# Patient Record
Sex: Male | Born: 1965 | Race: White | Hispanic: No | Marital: Single | Smoking: Current every day smoker
Health system: Southern US, Community
[De-identification: ages and names within clinical notes are randomized; demographics above are authoritative.]

## PROBLEM LIST (undated history)

## (undated) DIAGNOSIS — E7439 Other disorders of intestinal carbohydrate absorption: Secondary | ICD-10-CM

## (undated) HISTORY — PX: FOOT SURGERY: SHX648

---

## 2008-02-07 ENCOUNTER — Emergency Department (HOSPITAL_COMMUNITY): Admission: EM | Admit: 2008-02-07 | Discharge: 2008-02-08 | Payer: Self-pay | Admitting: Emergency Medicine

## 2008-11-22 ENCOUNTER — Emergency Department (HOSPITAL_COMMUNITY): Admission: EM | Admit: 2008-11-22 | Discharge: 2008-11-22 | Payer: Self-pay | Admitting: Emergency Medicine

## 2008-12-06 ENCOUNTER — Emergency Department (HOSPITAL_COMMUNITY): Admission: EM | Admit: 2008-12-06 | Discharge: 2008-12-06 | Payer: Self-pay | Admitting: Emergency Medicine

## 2009-08-14 IMAGING — CR DG THORACIC SPINE 2V
2 series · 2 of 2 positions shown · non-contrast
Comparison: None

CLINICAL DATA: Back and shoulder pain

THORACIC SPINE - 2 VIEW

[t t-spine a.p.]
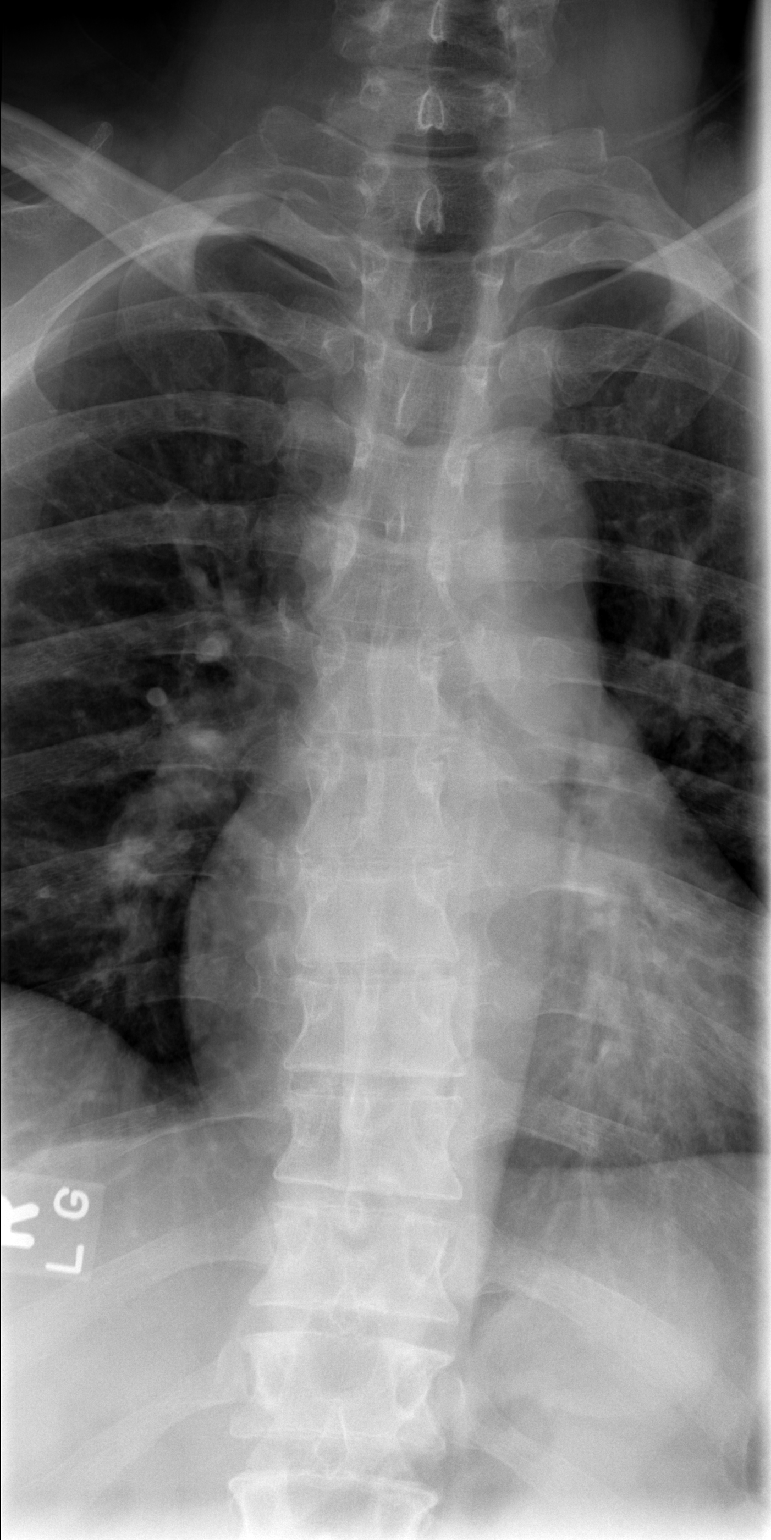

[t t-spine lat]
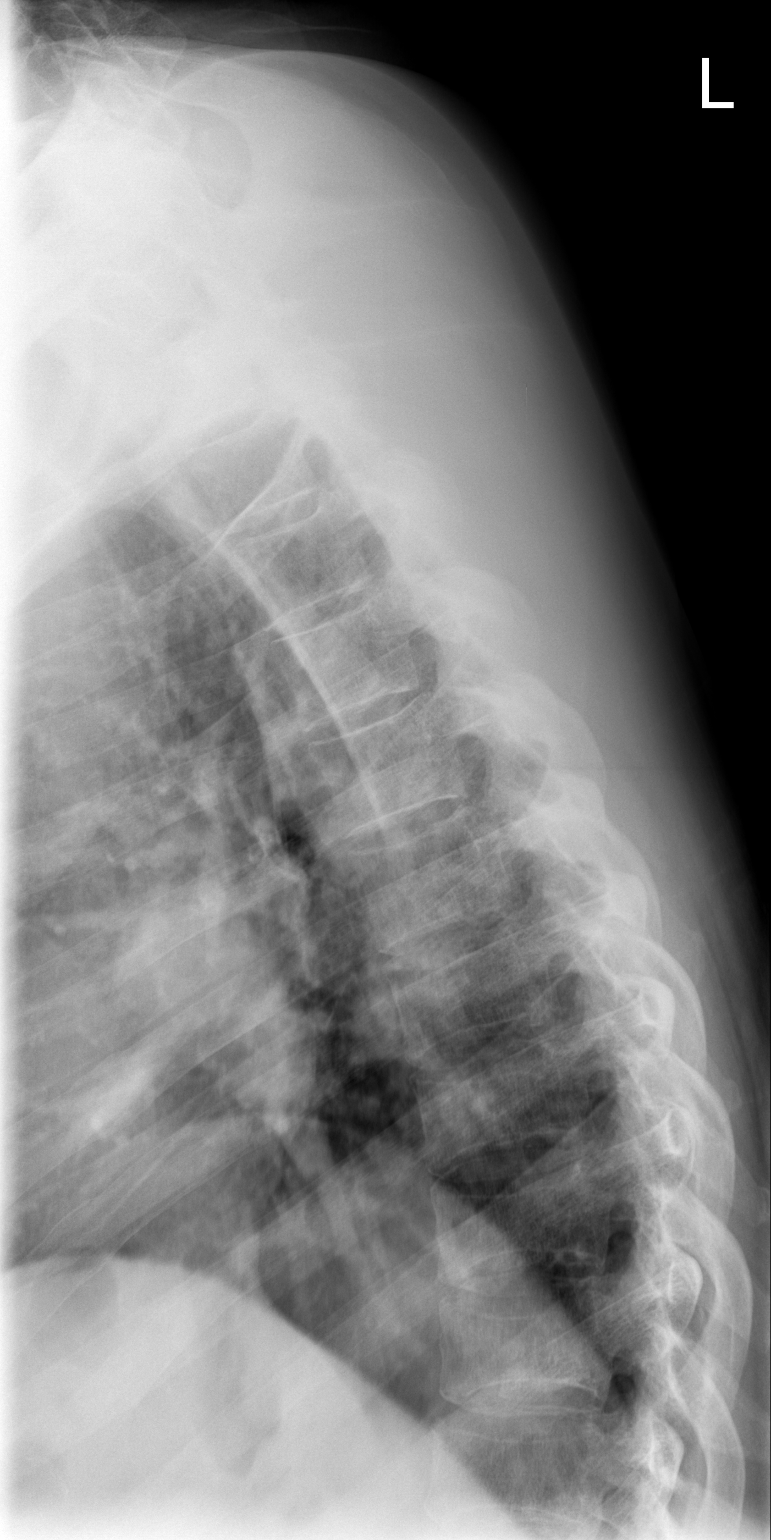

[2 of 2 positions shown; findings below may reference images not displayed]

FINDINGS: There is a mild thoracic - lumbar scoliosis deformity.

The vertebral body heights and disc spaces are well preserved.

No fractures are identified.
IMPRESSION: 1.  No acute findings.

## 2018-01-04 ENCOUNTER — Encounter (HOSPITAL_COMMUNITY): Payer: Self-pay | Admitting: Emergency Medicine

## 2018-01-04 ENCOUNTER — Emergency Department (HOSPITAL_COMMUNITY): Payer: Self-pay

## 2018-01-04 ENCOUNTER — Observation Stay (HOSPITAL_COMMUNITY)
Admission: EM | Admit: 2018-01-04 | Discharge: 2018-01-06 | Disposition: A | Discharge status: Home / Self Care | Payer: Self-pay | Attending: Internal Medicine | Admitting: Internal Medicine

## 2018-01-04 ENCOUNTER — Other Ambulatory Visit: Payer: Self-pay

## 2018-01-04 DIAGNOSIS — I25759 Atherosclerosis of native coronary artery of transplanted heart with unspecified angina pectoris: Secondary | ICD-10-CM | POA: Diagnosis present

## 2018-01-04 DIAGNOSIS — F1010 Alcohol abuse, uncomplicated: Secondary | ICD-10-CM | POA: Insufficient documentation

## 2018-01-04 DIAGNOSIS — L03011 Cellulitis of right finger: Secondary | ICD-10-CM | POA: Insufficient documentation

## 2018-01-04 DIAGNOSIS — R079 Chest pain, unspecified: Secondary | ICD-10-CM

## 2018-01-04 DIAGNOSIS — F17200 Nicotine dependence, unspecified, uncomplicated: Secondary | ICD-10-CM | POA: Insufficient documentation

## 2018-01-04 DIAGNOSIS — I10 Essential (primary) hypertension: Secondary | ICD-10-CM | POA: Diagnosis present

## 2018-01-04 DIAGNOSIS — R7302 Impaired glucose tolerance (oral): Secondary | ICD-10-CM | POA: Insufficient documentation

## 2018-01-04 DIAGNOSIS — R0789 Other chest pain: Principal | ICD-10-CM | POA: Insufficient documentation

## 2018-01-04 HISTORY — DX: Other disorders of intestinal carbohydrate absorption: E74.39

## 2018-01-04 LAB — PROTIME-INR
INR: 1.03
Prothrombin Time: 13.4 seconds (ref 11.4–15.2)

## 2018-01-04 LAB — CBC
HCT: 44 % (ref 39.0–52.0)
HEMOGLOBIN: 14.8 g/dL (ref 13.0–17.0)
MCH: 30.9 pg (ref 26.0–34.0)
MCHC: 33.6 g/dL (ref 30.0–36.0)
MCV: 91.9 fL (ref 78.0–100.0)
Platelets: 253 10*3/uL (ref 150–400)
RBC: 4.79 MIL/uL (ref 4.22–5.81)
RDW: 12.6 % (ref 11.5–15.5)
WBC: 8.9 10*3/uL (ref 4.0–10.5)

## 2018-01-04 LAB — BASIC METABOLIC PANEL
Anion gap: 11 (ref 5–15)
BUN: 21 mg/dL — ABNORMAL HIGH (ref 6–20)
CO2: 20 mmol/L — AB (ref 22–32)
Calcium: 9.3 mg/dL (ref 8.9–10.3)
Chloride: 108 mmol/L (ref 98–111)
Creatinine, Ser: 0.9 mg/dL (ref 0.61–1.24)
GFR calc non Af Amer: >60 mL/min (ref >60)
Glucose, Bld: 111 mg/dL — ABNORMAL HIGH (ref 70–99)
POTASSIUM: 4.1 mmol/L (ref 3.5–5.1)
Sodium: 139 mmol/L (ref 135–145)

## 2018-01-04 LAB — I-STAT TROPONIN, ED: Troponin i, poc: 0 ng/mL (ref 0.00–0.08)

## 2018-01-04 LAB — TROPONIN I

## 2018-01-04 LAB — APTT: APTT: 35 s (ref 24–36)

## 2018-01-04 LAB — MRSA PCR SCREENING: MRSA BY PCR: NEGATIVE

## 2018-01-04 MED ORDER — SODIUM CHLORIDE 0.9 % IV SOLN: 250.0000 mL | INTRAVENOUS | Status: DC | PRN

## 2018-01-04 MED ORDER — ADULT MULTIVITAMIN W/MINERALS CH
1.0000 | ORAL_TABLET | Freq: Every day | ORAL | Status: DC
  Administered 2018-01-04 – 2018-01-06 (×3): 1 via ORAL
  Filled 2018-01-04 (×3): qty 1

## 2018-01-04 MED ORDER — ACETAMINOPHEN 325 MG PO TABS
650.0000 mg | ORAL_TABLET | Freq: Four times a day (QID) | ORAL | Status: DC | PRN
  Administered 2018-01-05: 650 mg via ORAL
  Filled 2018-01-04: qty 2

## 2018-01-04 MED ORDER — NITROGLYCERIN 0.4 MG SL SUBL
0.4000 mg | SUBLINGUAL_TABLET | SUBLINGUAL | Status: AC | PRN
  Administered 2018-01-04 (×3): 0.4 mg via SUBLINGUAL
  Filled 2018-01-04: qty 1

## 2018-01-04 MED ORDER — CARVEDILOL 3.125 MG PO TABS
3.1250 mg | ORAL_TABLET | Freq: Two times a day (BID) | ORAL | Status: DC
  Administered 2018-01-04 – 2018-01-05 (×2): 3.125 mg via ORAL
  Filled 2018-01-04 (×2): qty 1

## 2018-01-04 MED ORDER — ASPIRIN EC 325 MG PO TBEC
325.0000 mg | DELAYED_RELEASE_TABLET | Freq: Every day | ORAL | Status: DC
  Administered 2018-01-05 – 2018-01-06 (×2): 325 mg via ORAL
  Filled 2018-01-04 (×2): qty 1

## 2018-01-04 MED ORDER — HYDRALAZINE HCL 20 MG/ML IJ SOLN: 5.0000 mg | Freq: Four times a day (QID) | INTRAMUSCULAR | Status: DC | PRN

## 2018-01-04 MED ORDER — LORAZEPAM 2 MG/ML IJ SOLN: 0.0000 mg | Freq: Two times a day (BID) | INTRAMUSCULAR | Status: DC

## 2018-01-04 MED ORDER — LORAZEPAM 1 MG PO TABS: 1.0000 mg | ORAL_TABLET | Freq: Four times a day (QID) | ORAL | Status: DC | PRN

## 2018-01-04 MED ORDER — ENOXAPARIN SODIUM 40 MG/0.4ML ~~LOC~~ SOLN
40.0000 mg | SUBCUTANEOUS | Status: DC
  Administered 2018-01-04 – 2018-01-05 (×2): 40 mg via SUBCUTANEOUS
  Filled 2018-01-04 (×2): qty 0.4

## 2018-01-04 MED ORDER — MORPHINE SULFATE (PF) 2 MG/ML IV SOLN: 0.5000 mg | INTRAVENOUS | Status: DC | PRN

## 2018-01-04 MED ORDER — ATORVASTATIN CALCIUM 20 MG PO TABS
80.0000 mg | ORAL_TABLET | Freq: Every day | ORAL | Status: DC
  Administered 2018-01-05: 80 mg via ORAL
  Filled 2018-01-04 (×2): qty 4

## 2018-01-04 MED ORDER — LORAZEPAM 2 MG/ML IJ SOLN: 1.0000 mg | Freq: Four times a day (QID) | INTRAMUSCULAR | Status: DC | PRN

## 2018-01-04 MED ORDER — SODIUM CHLORIDE 0.9% FLUSH: 3.0000 mL | INTRAVENOUS | Status: DC | PRN

## 2018-01-04 MED ORDER — ASPIRIN 81 MG PO CHEW
324.0000 mg | CHEWABLE_TABLET | Freq: Once | ORAL | Status: AC
  Administered 2018-01-04: 324 mg via ORAL
  Filled 2018-01-04: qty 4

## 2018-01-04 MED ORDER — THIAMINE HCL 100 MG/ML IJ SOLN
100.0000 mg | Freq: Every day | INTRAMUSCULAR | Status: DC
  Filled 2018-01-04: qty 2

## 2018-01-04 MED ORDER — NITROGLYCERIN 2 % TD OINT
1.0000 [in_us] | TOPICAL_OINTMENT | Freq: Three times a day (TID) | TRANSDERMAL | Status: DC
  Administered 2018-01-04 – 2018-01-06 (×5): 1 [in_us] via TOPICAL
  Filled 2018-01-04: qty 30

## 2018-01-04 MED ORDER — LORAZEPAM 2 MG/ML IJ SOLN: 0.0000 mg | Freq: Four times a day (QID) | INTRAMUSCULAR | Status: DC

## 2018-01-04 MED ORDER — SODIUM CHLORIDE 0.9% FLUSH
3.0000 mL | Freq: Two times a day (BID) | INTRAVENOUS | Status: DC
  Administered 2018-01-04 – 2018-01-06 (×4): 3 mL via INTRAVENOUS

## 2018-01-04 MED ORDER — VITAMIN B-1 100 MG PO TABS
100.0000 mg | ORAL_TABLET | Freq: Every day | ORAL | Status: DC
  Administered 2018-01-04 – 2018-01-06 (×3): 100 mg via ORAL
  Filled 2018-01-04 (×3): qty 1

## 2018-01-04 MED ORDER — ACETAMINOPHEN 650 MG RE SUPP: 650.0000 mg | Freq: Four times a day (QID) | RECTAL | Status: DC | PRN

## 2018-01-04 MED ORDER — NICOTINE 21 MG/24HR TD PT24
21.0000 mg | MEDICATED_PATCH | Freq: Every day | TRANSDERMAL | Status: DC | PRN
  Filled 2018-01-04: qty 1

## 2018-01-04 MED ORDER — FOLIC ACID 1 MG PO TABS
1.0000 mg | ORAL_TABLET | Freq: Every day | ORAL | Status: DC
  Administered 2018-01-04 – 2018-01-06 (×3): 1 mg via ORAL
  Filled 2018-01-04 (×3): qty 1

## 2018-01-04 NOTE — ED Triage Notes (Signed)
Pt reports L chest pain radiating to L arm. Pt reports his L arm feels numb. Pt reports some SHOB.  Denies dizziness, nausea, vomiting.

## 2018-01-04 NOTE — ED Notes (Signed)
Patient transported to X-ray 

## 2018-01-04 NOTE — ED Provider Notes (Signed)
[facility] Provider Note   CSN: [id number] Arrival date & time: 01/04/18  1715     History   Chief Complaint Chief Complaint  Patient presents with  . Chest Pain    HPI Marc Waters is a 52 y.o. male.  The history is provided by the patient. No language interpreter was used.  Chest Pain       52 year old male presenting for evaluation of chest pain.  Patient report he works in [employer] business.  Today after even walking walking home he developed pain to his chest.  He described pain as localized behind his sternum, sharp, tightness, radiates to his left shoulder and arm with weakness to his left arm.  Pain is moderate in severity and has been persistent for the past 2 hours.  He denies any associated lightheadedness, dizziness, diaphoresis, nausea, shortness of breath, productive cough, hemoptysis, abdominal pain.  He reportedly started a new job and has been walking an hour each way.  He also admits to smoking half pack a day and drinking moderate amount of alcohol daily.  He has not been seen by a primary care doctor in many years so he denies any other significant medical history.  Does have family history of cardiac disease.  He denies any specific treatment tried.  He does endorse occasional heart fluttering sensation.  He also admits to having history of sciatica in which he takes NSAIDs regularly.  Admits to having history of acid reflux but states that this felt different.  History reviewed. No pertinent past medical history.  There are no active problems to display for this patient.   Past Surgical History:  Procedure Laterality Date  . FOOT SURGERY          Home Medications    Prior to Admission medications   Not on File    Family History No family history on file.  Social History Social History   Tobacco Use  . Smoking status: Current Every Day Smoker    Packs/day: 0.50  . Smokeless tobacco: Never  Used  Substance Use Topics  . Alcohol use: Yes    Alcohol/week: 24.0 standard drinks    Types: 24 Cans of beer per week  . Drug use: Not Currently    Types: Cocaine     Allergies   Patient has no known allergies.   Review of Systems Review of Systems  Cardiovascular: Positive for chest pain.  All other systems reviewed and are negative.    Physical Exam Updated Vital Signs BP (!) 168/105   Pulse 92   Temp 97.9 F (36.6 C) (Oral)   Resp 18   Ht 5\' 9"  (1.753 m)   Wt 81.6 kg   SpO2 99%   BMI 26.58 kg/m   Physical Exam  Constitutional: He appears well-developed and well-nourished. No distress.  HENT:  Head: Atraumatic.  Eyes: Conjunctivae are normal.  Neck: Neck supple.  Cardiovascular: Normal rate, regular rhythm, intact distal pulses and normal pulses.  Murmur heard.  Systolic murmur is present with a grade of 3/6. Pulmonary/Chest: Effort normal and breath sounds normal. He has no decreased breath sounds. He has no wheezes. He has no rhonchi. He has no rales.  Abdominal: Soft. There is no tenderness.  Musculoskeletal:       Right lower leg: He exhibits no edema.       Left lower leg: He exhibits no edema.  Neurological: He is alert.  Skin: No rash noted.  Psychiatric: He has a normal mood and affect.  Nursing note and vitals reviewed.    ED Treatments / Results  Labs (all labs ordered are listed, but only abnormal results are displayed) Labs Reviewed  BASIC METABOLIC PANEL - Abnormal; Notable for the following components:      Result Value   CO2 20 (*)    Glucose, Bld 111 (*)    BUN 21 (*)    All other components within normal limits  CBC  I-STAT TROPONIN, ED    EKG EKG Interpretation  Date/Time:  Sunday January 04 2018 17:18:12 EDT Ventricular Rate:  90 PR Interval:  146 QRS Duration: 86 QT Interval:  354 QTC Calculation: 433 R Axis:   24 Text Interpretation:  Normal sinus rhythm Septal infarct , age undetermined Abnormal ECG Confirmed by  Kennis Carina [phone]) on 01/04/2018 7:00:47 PM   ED ECG REPORT   Date: 01/04/2018  Rate: 90  Rhythm: normal sinus rhythm  QRS Axis: normal  Intervals: normal  ST/T Wave abnormalities: septal infarct, age undetermined  Conduction Disutrbances:none  Narrative Interpretation:   Old EKG Reviewed: none available  I have personally reviewed the EKG tracing and agree with the computerized printout as noted.   Radiology Dg Chest 2 View  Result Date: 01/04/2018 CLINICAL DATA:  Pt reports centralized chest pain radiating to L arm x approx. 2 hours. Pt reports his L arm feels numb. Pt reports some SOB. Denies dizziness, nausea, vomiting. Smoker EXAM: CHEST - 2 VIEW COMPARISON:  None. FINDINGS: The heart size and mediastinal contours are within normal limits. Both lungs are clear. No pleural effusion or pneumothorax. The visualized skeletal structures are unremarkable. IMPRESSION: No active cardiopulmonary disease. Electronically Signed   By: Amie Portland M.D.   On: 01/04/2018 18:13    Procedures Procedures (including critical care time)  Medications Ordered in ED Medications  nitroGLYCERIN (NITROSTAT) SL tablet 0.4 mg (0.4 mg Sublingual Given 01/04/18 1843)  aspirin chewable tablet 324 mg (324 mg Oral Given 01/04/18 1821)     Initial Impression / Assessment and Plan / ED Course  I have reviewed the triage vital signs and the nursing notes.  Pertinent labs & imaging results that were available during my care of the patient were reviewed by me and considered in my medical decision making (see chart for details).     BP 129/73   Pulse 64   Temp 97.9 F (36.6 C) (Oral)   Resp (!) 28   Ht 5\' 9"  (1.753 m)   Wt 81.6 kg   SpO2 95%   BMI 26.58 kg/m    Final Clinical Impressions(s) / ED Diagnoses   Final diagnoses:  Exertional chest pain    ED Discharge Orders    None     5:57 PM Patient here with chest pain for the past 2 hours.  He has never had any cardiac work-up in the  past.  He still endorse moderate active chest pain.  EKG without acute changes.  7:46 PM After receiving sublingual nitro and aspirin patient states that the pain has almost completely resolved.  He is resting comfortably.  Initial troponin is negative, labs are reassuring, chest x-ray unremarkable.  Patient does not have a primary care provider and does not normally follow-up for health related issues, patient would likely benefit from hospitalization for a chest pain rule out work-up.  Care discussed with Dr. Pilar Plate.   Fayrene Helper, PA-C 01/04/18 1959    Sabas Sous, MD 01/05/18 [phone]

## 2018-01-04 NOTE — ED Notes (Signed)
Admitting MD Kim at bedside.  

## 2018-01-04 NOTE — H&P (Addendum)
TR[facility] H&P   Patient Demographics:    Marc Waters, is a 52 y.o. male  MRN: [id number]   DOB - [birth year]  Admit Date - 01/04/2018  Outpatient Primary MD for the patient is Patient, [name]  Referring MD/NP/PA:   Kennis Carina  Outpatient Specialists:   Patient coming from: home  Chief Complaint  Patient presents with  . Chest Pain      HPI:    Marc Waters  is a 52 y.o. male, w h/o glucose intolerance, etoh use, nicotine dep apparently c/o chest pain substernal chest pain with radiation to the left arm starting at 3:30pm while walking home.  Denies fever, chills, cough, palp, sob, nausea, vomitting , heartburn, abd pain, diarrhea, brbpr.   In ED, pt given nitro without complete relief   ekg nsr at 90, nl axis, nl pr, slight j point elevation in v2. Poor R progression   CXR IMPRESSION: No active cardiopulmonary disease.  Na 139, K 4.1, Bun 21, Creatinine 0.90 Wbc 8.9, Hgb 14.8, Plt 253 Trop 0.00  Pt will be admitted for w/up of chest pain   Review of systems:    In addition to the HPI above,  No Fever-chills, No Headache, No changes with Vision or hearing, No problems swallowing food or Liquids, No Cough or Shortness of Breath, No Abdominal pain, No Nausea or Vommitting, Bowel movements are regular, No Blood in stool or Urine, No dysuria, No new skin rashes or bruises, No new joints pains-aches,  No new weakness, tingling, numbness in any extremity, No recent weight gain or loss, No polyuria, polydypsia or polyphagia, No significant Mental Stressors.  A full 10 point Review of Systems was done, except as stated above, all other Review of Systems were negative.   With Past History of the following :    Past Medical History:  Diagnosis Date  . Glucose intolerance       Past Surgical History:  Procedure Laterality Date  . FOOT SURGERY         Social History:     Social History   Tobacco Use  . Smoking status: Current Every Day Smoker    Packs/day: 0.50  . Smokeless tobacco: Never Used  Substance Use Topics  . Alcohol use: Yes    Alcohol/week: 24.0 standard drinks    Types: 24 Cans of beer per week     Lives - at home  Mobility - walks by self   Family History :     Family History  Problem Relation Age of Onset  . CAD Mother        heart attack at age 66  . Stroke Mother   . CAD Father        CABG at aged 69, died at age 59  . Heart failure Father   . COPD Father        Home  Medications:   Prior to Admission medications   Medication Sig Start Date End Date Taking? Authorizing Provider  ASA-APAP-Salicyl-Caff TABS Take 1-2 tablets by mouth daily as needed (Pain).   Yes [provider]     Allergies:    No Known Allergies   Physical Exam:   Vitals  Blood pressure 129/73, pulse 64, temperature 97.9 F (36.6 C), temperature source Oral, resp. rate (!) 28, height 5\' 9"  (1.753 m), weight 81.6 kg, SpO2 95 %.   1. General  lying in bed in NAD,    2. Normal affect and insight, Not Suicidal or Homicidal, Awake Alert, Oriented X 3.  3. No F.N deficits, ALL C.Nerves Intact, Strength 5/5 all 4 extremities, Sensation intact all 4 extremities, Plantars down going.  4. Ears and Eyes appear Normal, Conjunctivae clear, PERRLA. Moist Oral Mucosa.  5. Supple Neck, No JVD, No cervical lymphadenopathy appriciated, No Carotid Bruits.  6. Symmetrical Chest wall movement, Good air movement bilaterally, CTAB.  7. RRR, No Gallops, Rubs or Murmurs, No Parasternal Heave.  8. Positive Bowel Sounds, Abdomen Soft, No tenderness, No organomegaly appriciated,No rebound -guarding or rigidity.  9.  No Cyanosis, Normal Skin Turgor, No Skin Rash or Bruise.  10. Good muscle tone,  joints appear normal , no effusions, Normal ROM.  11. No Palpable Lymph Nodes in Neck or Axillae      Data Review:     CBC Recent Labs  Lab 01/04/18 1726  WBC 8.9  HGB 14.8  HCT 44.0  PLT 253  MCV 91.9  MCH 30.9  MCHC 33.6  RDW 12.6   ------------------------------------------------------------------------------------------------------------------  Chemistries  Recent Labs  Lab 01/04/18 1726  NA 139  K 4.1  CL 108  CO2 20*  GLUCOSE 111*  BUN 21*  CREATININE 0.90  CALCIUM 9.3   ------------------------------------------------------------------------------------------------------------------ estimated creatinine clearance is 94.9 mL/min (by C-G formula based on SCr of 0.9 mg/dL). ------------------------------------------------------------------------------------------------------------------ No results for input(s): TSH, T4TOTAL, T3FREE, THYROIDAB in the last 72 hours.  Invalid input(s): FREET3  Coagulation profile No results for input(s): INR, PROTIME in the last 168 hours. ------------------------------------------------------------------------------------------------------------------- No results for input(s): DDIMER in the last 72 hours. -------------------------------------------------------------------------------------------------------------------  Cardiac Enzymes No results for input(s): CKMB, TROPONINI, MYOGLOBIN in the last 168 hours.  Invalid input(s): CK ------------------------------------------------------------------------------------------------------------------ No results found for: BNP   ---------------------------------------------------------------------------------------------------------------  Urinalysis No results found for: COLORURINE, APPEARANCEUR, LABSPEC, PHURINE, GLUCOSEU, HGBUR, BILIRUBINUR, KETONESUR, PROTEINUR, UROBILINOGEN, NITRITE, LEUKOCYTESUR  ----------------------------------------------------------------------------------------------------------------   Imaging Results:    Dg Chest 2 View  Result Date: 01/04/2018 CLINICAL DATA:  Pt  reports centralized chest pain radiating to L arm x approx. 2 hours. Pt reports his L arm feels numb. Pt reports some SOB. Denies dizziness, nausea, vomiting. Smoker EXAM: CHEST - 2 VIEW COMPARISON:  None. FINDINGS: The heart size and mediastinal contours are within normal limits. Both lungs are clear. No pleural effusion or pneumothorax. The visualized skeletal structures are unremarkable. IMPRESSION: No active cardiopulmonary disease. Electronically Signed   By: Amie Portlandavid  Ormond M.D.   On: 01/04/2018 18:13       Assessment & Plan:    Active Problems:   Chest pain    Chest pain Tele Trop I q6h x3 Check hga1c, lipid Check cardiac echo Start aspirin 325mg  po qday Start Lipitor 80mg  po qhs Start Nitropaste 1 inch topically q8h Start Carvedilol 3.125mg  po bid NPO after [facility] Cardiology consult requested by email Appreciate cardiology input  Etoh dep CIWA  Nicotine dep Nicotine patch prn  DVT Prophylaxis  Lovenox - SCDs  AM Labs Ordered, also please review Full Orders  Family Communication: Admission, patients condition and plan of care including tests being ordered have been discussed with the patient  who indicate understanding and agree with the plan and Code Status.  Code Status  FULL CODE  Likely DC to  home  Condition GUARDED    Consults called:  Cardiology by email  Admission status: observation  Time spent in minutes : 60   Pearson Grippe M.D on 01/04/2018 at 8:14 PM  Between 7am to 7pm - Pager - [phone]  . After 7pm go to [email or web address] - password [facility]  [facility] - Office  [phone]

## 2018-01-05 ENCOUNTER — Observation Stay (HOSPITAL_BASED_OUTPATIENT_CLINIC_OR_DEPARTMENT_OTHER): Payer: Self-pay

## 2018-01-05 ENCOUNTER — Observation Stay (HOSPITAL_COMMUNITY): Payer: Self-pay

## 2018-01-05 ENCOUNTER — Other Ambulatory Visit: Payer: Self-pay

## 2018-01-05 DIAGNOSIS — R079 Chest pain, unspecified: Secondary | ICD-10-CM

## 2018-01-05 DIAGNOSIS — I10 Essential (primary) hypertension: Secondary | ICD-10-CM | POA: Diagnosis present

## 2018-01-05 LAB — CBC
HCT: 41.4 % (ref 39.0–52.0)
Hemoglobin: 13.9 g/dL (ref 13.0–17.0)
MCH: 30.9 pg (ref 26.0–34.0)
MCHC: 33.6 g/dL (ref 30.0–36.0)
MCV: 92 fL (ref 78.0–100.0)
PLATELETS: 220 10*3/uL (ref 150–400)
RBC: 4.5 MIL/uL (ref 4.22–5.81)
RDW: 12.7 % (ref 11.5–15.5)
WBC: 10.1 10*3/uL (ref 4.0–10.5)

## 2018-01-05 LAB — LIPID PANEL
Cholesterol: 120 mg/dL (ref 0–200)
HDL: 55 mg/dL (ref >40)
LDL CALC: 57 mg/dL (ref 0–99)
Total CHOL/HDL Ratio: 2.2 RATIO
Triglycerides: 38 mg/dL (ref <150)
VLDL: 8 mg/dL (ref 0–40)

## 2018-01-05 LAB — TROPONIN I: Troponin I: <0.03 ng/mL (ref <0.03)

## 2018-01-05 LAB — COMPREHENSIVE METABOLIC PANEL
ALBUMIN: 3.8 g/dL (ref 3.5–5.0)
ALK PHOS: 59 U/L (ref 38–126)
ALT: 23 U/L (ref 0–44)
AST: 23 U/L (ref 15–41)
Anion gap: 9 (ref 5–15)
BILIRUBIN TOTAL: 1.2 mg/dL (ref 0.3–1.2)
BUN: 15 mg/dL (ref 6–20)
CALCIUM: 8.9 mg/dL (ref 8.9–10.3)
CO2: 23 mmol/L (ref 22–32)
CREATININE: 0.76 mg/dL (ref 0.61–1.24)
Chloride: 106 mmol/L (ref 98–111)
GFR calc non Af Amer: >60 mL/min (ref >60)
GLUCOSE: 103 mg/dL — AB (ref 70–99)
Potassium: 3.8 mmol/L (ref 3.5–5.1)
SODIUM: 138 mmol/L (ref 135–145)
Total Protein: 6.5 g/dL (ref 6.5–8.1)

## 2018-01-05 LAB — ECHOCARDIOGRAM COMPLETE
HEIGHTINCHES: 69 in
WEIGHTICAEL: 2841.6 [oz_av]

## 2018-01-05 LAB — HIV ANTIBODY (ROUTINE TESTING W REFLEX): HIV Screen 4th Generation wRfx: NONREACTIVE

## 2018-01-05 MED ORDER — METOPROLOL TARTRATE 50 MG PO TABS
50.0000 mg | ORAL_TABLET | Freq: Once | ORAL | Status: AC
  Administered 2018-01-05: 50 mg via ORAL
  Filled 2018-01-05: qty 1

## 2018-01-05 MED ORDER — NITROGLYCERIN 0.4 MG SL SUBL
SUBLINGUAL_TABLET | SUBLINGUAL | Status: AC
  Filled 2018-01-05: qty 2

## 2018-01-05 MED ORDER — IOPAMIDOL (ISOVUE-370) INJECTION 76%
100.0000 mL | Freq: Once | INTRAVENOUS | Status: AC | PRN
  Administered 2018-01-05: 100 mL via INTRAVENOUS

## 2018-01-05 MED ORDER — NITROGLYCERIN 0.4 MG SL SUBL
0.8000 mg | SUBLINGUAL_TABLET | Freq: Once | SUBLINGUAL | Status: AC
  Administered 2018-01-05: 0.8 mg via SUBLINGUAL

## 2018-01-05 MED ORDER — METOPROLOL TARTRATE 5 MG/5ML IV SOLN
5.0000 mg | INTRAVENOUS | Status: AC | PRN
  Administered 2018-01-05: 5 mg via INTRAVENOUS

## 2018-01-05 MED ORDER — METOPROLOL TARTRATE 5 MG/5ML IV SOLN
INTRAVENOUS | Status: AC
  Filled 2018-01-05: qty 5

## 2018-01-05 NOTE — Consult Note (Signed)
Cardiology Consultation:   Patient ID: Marc Waters; [id number]; 06/24/1965   Admit date: 01/04/2018 Date of Consult: 01/05/2018  Primary Care Provider: Patient, [name] Primary Cardiologist: Chrystie Nose, MD  Primary Electrophysiologist:     Patient Profile:   Marc Waters is a 52 y.o. male with a hx of glucose intolerance, alcohol use, and tobacco dependence who is being seen today for the evaluation of chest pain at the request of Dr. Sharon Seller.  History of Present Illness:   Marc Waters does not follow with cardiology. He had a history of glucose intolerance, no A1c available, etoh use and nicotine dependence. He presented to [facility] with complaints of chest pain that radiates to his left arm starting 3:30 pm while walking home. In the ER, hew as given nitro without complete relief of chest pain. Nitro patch applied did relieve his chest pain. Troponin x 2 negative. EKG with  He has been hypertensive this hospitalization.   On my interview, he works in the [employer] of [employer]. He is on his feet constantly and generally walks everywhere he goes. Yesterday at approximately 3:30 pm, he was walking home from the restaurant and started having left-sided chest pressure that eventually radiated down his left arm. This has never happened before. Chest pressure was rated as a 5-6/10 and had no associated symptoms. The chest pain persisted while he was walking and he called his brother to bring him to the ER. The chest pain waxed and waned all evening despite 1 SL nitro and nitro paste was applied which resolved his chest pain. He is currently chest pain free. He also states that over the past several months he has experienced palpitations and heart fluttering when he goes to bed at night. He notices the palpitations, but they do not generally keep him from sleeping. He is generally asymptomatic with these palpitations.   He has a family history of heart disease including  PCI in his mother at age 57 and CABG in his father in his 34s. He has one brother who does not have a cardiac history. He drinks alcohol and is a current every day smoker with a 5 pack year history. He has no medical problems listed, he does not see a PCP.    Past Medical History:  Diagnosis Date  . Glucose intolerance     Past Surgical History:  Procedure Laterality Date  . FOOT SURGERY       Home Medications:  Prior to Admission medications   Medication Sig Start Date End Date Taking? Authorizing Provider  ASA-APAP-Salicyl-Caff TABS Take 1-2 tablets by mouth daily as needed (Pain).   Yes [provider]    Inpatient Medications: Scheduled Meds: . aspirin EC  325 mg Oral Daily  . atorvastatin  80 mg Oral q1800  . carvedilol  3.125 mg Oral BID WC  . enoxaparin (LOVENOX) injection  40 mg Subcutaneous Q24H  . folic acid  1 mg Oral Daily  . LORazepam  0-4 mg Intravenous Q6H   Followed by  . [START ON 01/06/2018] LORazepam  0-4 mg Intravenous Q12H  . multivitamin with minerals  1 tablet Oral Daily  . nitroGLYCERIN  1 inch Topical Q8H  . sodium chloride flush  3 mL Intravenous Q12H  . thiamine  100 mg Oral Daily   Or  . thiamine  100 mg Intravenous Daily   Continuous Infusions: . sodium chloride     PRN Meds: sodium chloride, acetaminophen **OR** acetaminophen, hydrALAZINE,  LORazepam **OR** LORazepam, morphine injection, nicotine, sodium chloride flush  Allergies:   No Known Allergies  Social History:   Social History   Socioeconomic History  . Marital status: Single    Spouse name: Not on file  . Number of children: Not on file  . Years of education: Not on file  . Highest education level: Not on file  Occupational History  . Not on file  Social Needs  . Financial resource strain: Not on file  . Food insecurity:    Worry: Not on file    Inability: Not on file  . Transportation needs:    Medical: Not on file    Non-medical: Not on file  Tobacco Use    . Smoking status: Current Every Day Smoker    Packs/day: 0.50  . Smokeless tobacco: Never Used  Substance and Sexual Activity  . Alcohol use: Yes    Alcohol/week: 24.0 standard drinks    Types: 24 Cans of beer per week  . Drug use: Not Currently    Types: Cocaine  . Sexual activity: Not on file  Lifestyle  . Physical activity:    Days per week: Not on file    Minutes per session: Not on file  . Stress: Not on file  Relationships  . Social connections:    Talks on phone: Not on file    Gets together: Not on file    Attends religious service: Not on file    Active member of club or organization: Not on file    Attends meetings of clubs or organizations: Not on file    Relationship status: Not on file  . Intimate partner violence:    Fear of current or ex partner: Not on file    Emotionally abused: Not on file    Physically abused: Not on file    Forced sexual activity: Not on file  Other Topics Concern  . Not on file  Social History Narrative  . Not on file    Family History:    Family History  Problem Relation Age of Onset  . CAD Mother        heart attack at age 54  . Stroke Mother   . CAD Father        CABG at aged 54, died at age 54  . Heart failure Father   . COPD Father      ROS:  Please see the history of present illness.   All other ROS reviewed and negative.     Physical Exam/Data:   Vitals:   01/04/18 2015 01/04/18 2030 01/05/18 0545 01/05/18 0902  BP: (!) 163/96 (!) 157/97 119/68 (!) 152/84  Pulse: 79 82 90 79  Resp: (!) 25 (!) 28 18   Temp:   98.6 F (37 C)   TempSrc:   Oral   SpO2: 98% 100% 94%   Weight:   80.6 kg   Height:        Intake/Output Summary (Last 24 hours) at 01/05/2018 0920 Last data filed at 01/05/2018 0903 Gross per 24 hour  Intake 6 ml  Output -  Net 6 ml   Filed Weights   01/04/18 1721 01/05/18 0545  Weight: 81.6 kg 80.6 kg   Body mass index is 26.23 kg/m.  General:  Well nourished, well developed, in no acute  distress HEENT: normal Neck: no JVD Vascular: No carotid bruits Cardiac:  normal S1, S2; RRR; 2/6 murmur Lungs:  clear to auscultation bilaterally, no wheezing, rhonchi  or rales  Abd: soft, nontender, no hepatomegaly  Ext: no edema Musculoskeletal:  No deformities, BUE and BLE strength normal and equal Skin: warm and dry  Neuro:  CNs 2-12 intact, no focal abnormalities noted Psych:  Normal affect   EKG:  The EKG was personally reviewed and demonstrates:  Sinus rhythm, poor R wave progression Telemetry:  Telemetry was personally reviewed and demonstrates:  Sinus in the 80s  Relevant CV Studies:  none  Laboratory Data:  Chemistry Recent Labs  Lab 01/04/18 1726 01/05/18 0257  NA 139 138  K 4.1 3.8  CL 108 106  CO2 20* 23  GLUCOSE 111* 103*  BUN 21* 15  CREATININE 0.90 0.76  CALCIUM 9.3 8.9  GFRNONAA >60 >60  GFRAA >60 >60  ANIONGAP 11 9    Recent Labs  Lab 01/05/18 0257  PROT 6.5  ALBUMIN 3.8  AST 23  ALT 23  ALKPHOS 59  BILITOT 1.2   Hematology Recent Labs  Lab 01/04/18 1726 01/05/18 0257  WBC 8.9 10.1  RBC 4.79 4.50  HGB 14.8 13.9  HCT 44.0 41.4  MCV 91.9 92.0  MCH 30.9 30.9  MCHC 33.6 33.6  RDW 12.6 12.7  PLT 253 220   Cardiac Enzymes Recent Labs  Lab 01/04/18 2120 01/05/18 0257  TROPONINI <0.03 <0.03    Recent Labs  Lab 01/04/18 1729  TROPIPOC 0.00    BNPNo results for input(s): BNP, PROBNP in the last 168 hours.  DDimer No results for input(s): DDIMER in the last 168 hours.  Radiology/Studies:  Dg Chest 2 View  Result Date: 01/04/2018 CLINICAL DATA:  Pt reports centralized chest pain radiating to L arm x approx. 2 hours. Pt reports his L arm feels numb. Pt reports some SOB. Denies dizziness, nausea, vomiting. Smoker EXAM: CHEST - 2 VIEW COMPARISON:  None. FINDINGS: The heart size and mediastinal contours are within normal limits. Both lungs are clear. No pleural effusion or pneumothorax. The visualized skeletal structures are  unremarkable. IMPRESSION: No active cardiopulmonary disease. Electronically Signed   By: Amie Portlandavid  Ormond M.D.   On: 01/04/2018 18:13    Assessment and Plan:   1. Chest pain - troponin x 2 negative, third troponin pending - EKG with poor R wave progression, no prior for comparison - patient describes chest pain that is concerning for stable angina - he has risk factors including HTN, smoking history, and family history - interestingly, his lipid profile is normal: 01/05/2018: Cholesterol 120; HDL 55; LDL Cholesterol 57; Triglycerides 38; VLDL 8  - Hemoglobin A1c pending - given his overall clinical picture and history, would favor an inpatient ischemic evaluation - will discuss with Dr. Rennis GoldenHilty the utility of CT coronary in this patient   2. HTN - would benefit from addition of ACEI/ARB - will hold off on adding this until after ischemic evaluation for renal function   3. Current smoker - he knows he needs to quit   For questions or updates, please contact [facility] Please consult [email or web address] for contact info under Cardiology/STEMI.   Signed, Roe Rutherfordngela Nicole Duke, PA  01/05/2018 9:20 AM

## 2018-01-05 NOTE — Progress Notes (Signed)
CT coronary with FFR ordered. 18 gauge PIV ordered and he will receive 50 mg lopressor. Dr. Delton SeeNelson will read.

## 2018-01-05 NOTE — Progress Notes (Signed)
  Echocardiogram 2D Echocardiogram has been performed.  Rylie Limburg L Androw 01/05/2018, 1:10 PM

## 2018-01-05 NOTE — Progress Notes (Signed)
TEAM 1 - Stepdown/ICU TEAM  Marc Waters  ZOX:[id number] DOB: 02/06/1966 DOA: 01/04/2018 PCP: Patient, [name]    Brief Narrative:  52 y.o. male with a hx of glucose intolerance, alcohol use, and tobacco dependence who presented to the ED w/ c/o substernal chest pain with radiation to the left arm starting while walking.    Significant Events: 8/11 admit 8/12 cardiac CTa  Subjective: Resting comfortably in bed.  Reports that his chest pain is nearly completely resolved.  Denies sob, n/v, or abdom pain.    Assessment & Plan:  Chest pain No ischemic change on admit EKG and troponin negative - Cardiac CTangio noted moderate CAD in the proximal LAD w/ a coronary calcium score of 262 - LDL 57 - Cards following and contemplating possible cardiac cath 8/13   Impaired Glucose Tolerance A1c pending   EtOH abuse No evidence of withdrawal thus far   Tobacco dependence  Cont to counsel on absolute need for abstinence   DVT prophylaxis: lovenox  Code Status: FULL CODE Family Communication: no family present at time of exam  Disposition Plan: tele bed   [facility]  [facility]   Antimicrobials:  none   Objective: Blood pressure 130/81, pulse 69, temperature 98.2 F (36.8 C), temperature source Oral, resp. rate 13, height 5\' 9"  (1.753 m), weight 80.6 kg, SpO2 93 %.  Intake/Output Summary (Last 24 hours) at 01/05/2018 1702 Last data filed at 01/05/2018 0903 Gross per 24 hour  Intake 6 ml  Output 200 ml  Net -194 ml   Filed Weights   01/04/18 1721 01/05/18 0545  Weight: 81.6 kg 80.6 kg    Examination: General: No acute respiratory distress Lungs: Clear to auscultation bilaterally without wheezes or crackles Cardiovascular: Regular rate and rhythm without murmur gallop or rub normal S1 and S2 Abdomen: Nontender, nondistended, soft, bowel sounds positive, no rebound, no ascites, no appreciable mass Extremities: No significant cyanosis, clubbing, or  edema bilateral lower extremities  CBC: Recent Labs  Lab 01/04/18 1726 01/05/18 0257  WBC 8.9 10.1  HGB 14.8 13.9  HCT 44.0 41.4  MCV 91.9 92.0  PLT 253 220   Basic Metabolic Panel: Recent Labs  Lab 01/04/18 1726 01/05/18 0257  NA 139 138  K 4.1 3.8  CL 108 106  CO2 20* 23  GLUCOSE 111* 103*  BUN 21* 15  CREATININE 0.90 0.76  CALCIUM 9.3 8.9   GFR: Estimated Creatinine Clearance: 106.8 mL/min (by C-G formula based on SCr of 0.76 mg/dL).  Liver Function Tests: Recent Labs  Lab 01/05/18 0257  AST 23  ALT 23  ALKPHOS 59  BILITOT 1.2  PROT 6.5  ALBUMIN 3.8    Coagulation Profile: Recent Labs  Lab 01/04/18 2120  INR 1.03    Cardiac Enzymes: Recent Labs  Lab 01/04/18 2120 01/05/18 0257 01/05/18 0908  TROPONINI <0.03 <0.03 <0.03    Recent Results (from the past 240 hour(s))  MRSA PCR Screening     Status: None   Collection Time: 01/04/18  9:23 PM  Result Value Ref Range Status   MRSA by PCR NEGATIVE NEGATIVE Final    Comment:        The GeneXpert MRSA Assay (FDA approved for NASAL specimens only), is one component of a comprehensive MRSA colonization surveillance program. It is not intended to diagnose MRSA infection nor to guide or monitor treatment for MRSA infections. Performed at [facility], [address] [place], [place] [address]  Scheduled Meds: . aspirin EC  325 mg Oral Daily  . atorvastatin  80 mg Oral q1800  . enoxaparin (LOVENOX) injection  40 mg Subcutaneous Q24H  . folic acid  1 mg Oral Daily  . LORazepam  0-4 mg Intravenous Q6H   Followed by  . [START ON 01/06/2018] LORazepam  0-4 mg Intravenous Q12H  . metoprolol tartrate      . multivitamin with minerals  1 tablet Oral Daily  . nitroGLYCERIN  1 inch Topical Q8H  . nitroGLYCERIN      . sodium chloride flush  3 mL Intravenous Q12H  . thiamine  100 mg Oral Daily   Or  . thiamine  100 mg Intravenous Daily     LOS: 0 days   Lonia BloodJeffrey T. McClung, MD [facility] Office  [phone] Pager - Text Page per Loretha StaplerAmion  If 7PM-7AM, please contact night-coverage per Amion 01/05/2018, 5:02 PM

## 2018-01-06 DIAGNOSIS — R079 Chest pain, unspecified: Secondary | ICD-10-CM

## 2018-01-06 DIAGNOSIS — I10 Essential (primary) hypertension: Secondary | ICD-10-CM

## 2018-01-06 DIAGNOSIS — I25759 Atherosclerosis of native coronary artery of transplanted heart with unspecified angina pectoris: Secondary | ICD-10-CM | POA: Diagnosis present

## 2018-01-06 LAB — COMPREHENSIVE METABOLIC PANEL
ALK PHOS: 56 U/L (ref 38–126)
ALT: 18 U/L (ref 0–44)
AST: 17 U/L (ref 15–41)
Albumin: 3.5 g/dL (ref 3.5–5.0)
Anion gap: 11 (ref 5–15)
BILIRUBIN TOTAL: 0.9 mg/dL (ref 0.3–1.2)
BUN: 13 mg/dL (ref 6–20)
CALCIUM: 8.6 mg/dL — AB (ref 8.9–10.3)
CO2: 24 mmol/L (ref 22–32)
Chloride: 104 mmol/L (ref 98–111)
Creatinine, Ser: 0.78 mg/dL (ref 0.61–1.24)
GFR calc Af Amer: >60 mL/min (ref >60)
GFR calc non Af Amer: >60 mL/min (ref >60)
Glucose, Bld: 82 mg/dL (ref 70–99)
Potassium: 3.5 mmol/L (ref 3.5–5.1)
Sodium: 139 mmol/L (ref 135–145)
TOTAL PROTEIN: 6.5 g/dL (ref 6.5–8.1)

## 2018-01-06 LAB — MAGNESIUM: MAGNESIUM: 2 mg/dL (ref 1.7–2.4)

## 2018-01-06 LAB — CBC
HCT: 41.6 % (ref 39.0–52.0)
Hemoglobin: 14.2 g/dL (ref 13.0–17.0)
MCH: 31 pg (ref 26.0–34.0)
MCHC: 34.1 g/dL (ref 30.0–36.0)
MCV: 90.8 fL (ref 78.0–100.0)
Platelets: 210 10*3/uL (ref 150–400)
RBC: 4.58 MIL/uL (ref 4.22–5.81)
RDW: 12.3 % (ref 11.5–15.5)
WBC: 7.9 10*3/uL (ref 4.0–10.5)

## 2018-01-06 LAB — HEMOGLOBIN A1C
HEMOGLOBIN A1C: 5.3 % (ref 4.8–5.6)
MEAN PLASMA GLUCOSE: 105 mg/dL

## 2018-01-06 MED ORDER — ATORVASTATIN CALCIUM 40 MG PO TABS: 40.0000 mg | ORAL_TABLET | Freq: Every day | ORAL | 0 refills | Status: AC

## 2018-01-06 MED ORDER — ATORVASTATIN CALCIUM 40 MG PO TABS: 40.0000 mg | ORAL_TABLET | Freq: Every day | ORAL | Status: DC

## 2018-01-06 MED ORDER — ASPIRIN EC 81 MG PO TBEC: 81.0000 mg | DELAYED_RELEASE_TABLET | Freq: Every day | ORAL | Status: AC

## 2018-01-06 NOTE — Progress Notes (Signed)
Progress Note  Patient Name: Marc Waters Date of Encounter: 01/06/2018  Primary Cardiologist: Chrystie NoseKenneth C Hilty, MD   Subjective   Pt denies further chest pain, but does mention brief left shoulder pain that is now resolves. CT coronary yesterday with negative FFR.   Inpatient Medications    Scheduled Meds: . aspirin EC  325 mg Oral Daily  . atorvastatin  80 mg Oral q1800  . enoxaparin (LOVENOX) injection  40 mg Subcutaneous Q24H  . folic acid  1 mg Oral Daily  . LORazepam  0-4 mg Intravenous Q6H   Followed by  . LORazepam  0-4 mg Intravenous Q12H  . multivitamin with minerals  1 tablet Oral Daily  . nitroGLYCERIN  1 inch Topical Q8H  . sodium chloride flush  3 mL Intravenous Q12H  . thiamine  100 mg Oral Daily   Continuous Infusions: . sodium chloride     PRN Meds: sodium chloride, acetaminophen **OR** [DISCONTINUED] acetaminophen, hydrALAZINE, LORazepam **OR** LORazepam, morphine injection, nicotine, sodium chloride flush   Vital Signs    Vitals:   01/05/18 1519 01/05/18 2054 01/05/18 2329 01/06/18 0511  BP: 130/81 117/72 122/78 134/76  Pulse:   84 79  Resp:  17 20 16   Temp:   98.3 F (36.8 C) 98.7 F (37.1 C)  TempSrc:   Oral Oral  SpO2:   96% 96%  Weight:    80.1 kg  Height:        Intake/Output Summary (Last 24 hours) at 01/06/2018 0845 Last data filed at 01/06/2018 0500 Gross per 24 hour  Intake 506 ml  Output 800 ml  Net -294 ml   Filed Weights   01/04/18 1721 01/05/18 0545 01/06/18 0511  Weight: 81.6 kg 80.6 kg 80.1 kg    Telemetry    sinus - Personally Reviewed  ECG    No new tracings - Personally Reviewed  Physical Exam   GEN: No acute distress.   Neck: No JVD Cardiac: RRR, no murmurs, rubs, or gallops.  Respiratory: Clear to auscultation bilaterally. GI: Soft, nontender, non-distended  MS: No edema; No deformity. Neuro:  Nonfocal  Psych: Normal affect   Labs    Chemistry Recent Labs  Lab 01/04/18 1726 01/05/18 0257  01/06/18 0451  NA 139 138 139  K 4.1 3.8 3.5  CL 108 106 104  CO2 20* 23 24  GLUCOSE 111* 103* 82  BUN 21* 15 13  CREATININE 0.90 0.76 0.78  CALCIUM 9.3 8.9 8.6*  PROT  --  6.5 6.5  ALBUMIN  --  3.8 3.5  AST  --  23 17  ALT  --  23 18  ALKPHOS  --  59 56  BILITOT  --  1.2 0.9  GFRNONAA >60 >60 >60  GFRAA >60 >60 >60  ANIONGAP 11 9 11      Hematology Recent Labs  Lab 01/04/18 1726 01/05/18 0257 01/06/18 0451  WBC 8.9 10.1 7.9  RBC 4.79 4.50 4.58  HGB 14.8 13.9 14.2  HCT 44.0 41.4 41.6  MCV 91.9 92.0 90.8  MCH 30.9 30.9 31.0  MCHC 33.6 33.6 34.1  RDW 12.6 12.7 12.3  PLT 253 220 210    Cardiac Enzymes Recent Labs  Lab 01/04/18 2120 01/05/18 0257 01/05/18 0908  TROPONINI <0.03 <0.03 <0.03    Recent Labs  Lab 01/04/18 1729  TROPIPOC 0.00     BNPNo results for input(s): BNP, PROBNP in the last 168 hours.   DDimer No results for input(s): DDIMER in the  last 168 hours.   Radiology    Dg Chest 2 View  Result Date: 01/04/2018 CLINICAL DATA:  Pt reports centralized chest pain radiating to L arm x approx. 2 hours. Pt reports his L arm feels numb. Pt reports some SOB. Denies dizziness, nausea, vomiting. Smoker EXAM: CHEST - 2 VIEW COMPARISON:  None. FINDINGS: The heart size and mediastinal contours are within normal limits. Both lungs are clear. No pleural effusion or pneumothorax. The visualized skeletal structures are unremarkable. IMPRESSION: No active cardiopulmonary disease. Electronically Signed   By: Amie Portland M.D.   On: 01/04/2018 18:13   Ct Coronary Morph W/cta Cor W/score W/ca W/cm &/or Wo/cm  Addendum Date: 01/05/2018   ADDENDUM REPORT: 01/05/2018 15:32 CLINICAL DATA:  52 year old male with hypertension and chest pain. EXAM: Cardiac/Coronary  CT TECHNIQUE: The patient was scanned on a [employer]. FINDINGS: A 120 kV prospective scan was triggered in the descending thoracic aorta at 111 HU's. Axial non-contrast 3 mm slices were carried out  through the heart. The data set was analyzed on a dedicated work station and scored using the Agatson method. Gantry rotation speed was 250 msecs and collimation was .6 mm. 5 mg of iv Metoprolol and 0.8 mg of sl NTG was given. The 3D data set was reconstructed in 5% intervals of the 67-82 % of the R-R cycle. Diastolic phases were analyzed on a dedicated work station using MPR, MIP and VRT modes. The patient received 80 cc of contrast. Aorta:  Normal size.  No calcifications.  No dissection. Aortic Valve:  Trileaflet.  No calcifications. Coronary Arteries: Normal coronary origin. Right and left co-dominance. RCA is a large co-dominant artery that gives rise to PDA. There are minimal luminal irregularities. Left main is a short artery that gives rise to LAD and LCX arteries and has no significant plaque. LAD is a medium size vessel that gives rise to two large diagonal arteries. There is moderate ostial/proximal calcified plaque with associated stenosis 25-50%. This is followed by a moderate circumferential non-calcified plaque with stenosis 50-69%. D1 is a large branch with no significant plaque. D2 is a medium size branch with no significant plaque. LCX is a large co-dominant artery that gives rise to three OM branches and PLVB. There is only minimal non-calcified plaque. Other findings: Normal pulmonary vein drainage into the left atrium. Normal let atrial appendage without a thrombus. Normal size of the pulmonary artery. IMPRESSION: 1. Coronary calcium score of 262. This was 58 percentile for age and sex matched control. 2. Normal coronary origin with right and left dominance. 3. Moderate CAD in the proximal LAD. Additional analysis with CT FFR will be submitted. Electronically Signed   By: Tobias Alexander   On: 01/05/2018 15:32   Result Date: 01/05/2018 EXAM: OVER-READ INTERPRETATION  CT CHEST The following report is an over-read performed by radiologist Dr. Charlett Nose of [facility] on  01/05/2018. This over-read does not include interpretation of cardiac or coronary anatomy or pathology. The coronary CTA interpretation by the cardiologist is attached. COMPARISON:  None FINDINGS: Vascular: Heart is upper limits normal in size. Trace pericardial effusion. Visualized aorta is normal caliber. Mediastinum/Nodes: No adenopathy in the lower mediastinum or hila. Lungs/Pleura: Dependent atelectasis in the lower lobes. No effusions. Upper Abdomen: Imaging into the upper abdomen shows no acute findings. Musculoskeletal: Chest wall soft tissues are unremarkable. No acute bony abnormality. IMPRESSION: Dependent atelectasis in the lower lobes. Trace pericardial effusion. Electronically Signed: By: Charlett Nose M.D. On: 01/05/2018  14:48   Ct Coronary Fractional Flow Reserve Fluid Analysis  Result Date: 01/06/2018 EXAM: FF/RCT ANALYSIS FINDINGS: FFRct analysis was performed on the original cardiac CT angiogram dataset. Diagrammatic representation of the FFRct analysis is provided in a separate PDF document in PACS. This dictation was created using the PDF document and an interactive 3D model of the results. 3D model is not available in the EMR/PACS. Normal FFR range is >0.80. 1. Left Main:  No significant stenosis. 2. LAD: No significant stenosis. 3. LCX: No significant stenosis. 4. RCA: No significant stenosis. IMPRESSION: 1.  CT FFR analysis didn't show any significant stenosis. Electronically Signed   By: Tobias AlexanderKatarina  Nelson   On: 01/06/2018 07:29    Cardiac Studies   Coronary CT with FFR 01/05/18: IMPRESSION: 1. Coronary calcium score of 262. This was 7593 percentile for age and sex matched control.  2. Normal coronary origin with right and left dominance.  3. Moderate CAD in the proximal LAD. Additional analysis with CT FFR will be submitted.  IMPRESSION: 1.  CT FFR analysis didn't show any significant stenosis.  Patient Profile     52 y.o. male with a hx of glucose intolerance, alcohol  use, and tobacco dependence who is being seen today for the evaluation of chest pain  Assessment & Plan    1. Chest pain - ct coronary and FFR without significant flow limiting stenosis - etiology of chest pain is not coronary artery disease - echo with normal EF 50-55%, no wall motion abnormality - may have been spasm vs GERD - will defer further workup to primary - OK to discharge from a cardiac standpoint - he can follow up with cardiology PRN  2. Smoking - encouraged cessation given his risk factors  3. HLD - 01/05/2018: Cholesterol 120; HDL 55; LDL Cholesterol 57; Triglycerides 38; VLDL 8  4. Right thumb abscess - will notify primary for consideration of I&D and antibiotic prior to discharge   For questions or updates, please contact [facility] Please consult [email or web address] for contact info under Cardiology/STEMI.      Signed, Roe Rutherfordngela Nicole Gaege Sangalang, PA  01/06/2018, 8:45 AM

## 2018-01-06 NOTE — Discharge Summary (Signed)
DISCHARGE SUMMARY  Marc KielMaurice J Brossard  MR#: [id number]  DOB:06/05/1965  Date of Admission: 01/04/2018 Date of Discharge: 01/06/2018  Attending Physician:Okla Qazi Silvestre Gunner Ipek Westra, MD  Patient's NFA:[id number]:Patient, [name]  Consults: [facility]   Disposition: D/C home   Follow-up Appts: Follow-up Information    [facility], Angela Nicole, PA Follow up on 01/28/2018.   Specialties:  Physician Assistant, Cardiology, Radiology Why:  1:30 am hospital follow up  Contact information: [address] [place] [address] [phone]        [facility]. Schedule an appointment as soon as possible for a visit in 1 week(s).   Contact information: [address] [place] [phone]          Tests Needing Follow-up: -assess tobacco cessation -assess BP and lipid control -assess CBG -screen for possible bipolar d/o (brother feels may be present)  Discharge Diagnoses: Chest pain Impaired Glucose Tolerance EtOH abuse Tobacco dependence  Paronychia  Initial presentation: 52 y.o.malewith a hx of glucose intolerance, alcohol use, and tobacco dependencewho presented to the ED w/ c/o substernal chest pain with radiation to the left arm starting while walking.   Hospital Course:  Chest pain No ischemic change on admit EKG and troponin negative - Cardiac CTangio noted moderate CAD in the proximal LAD w/ a coronary calcium score of 262 but no flow limiting lesions - LDL 57 - Cards recommended the following:  Aggressive medical therapy is recommended. Lipid profile is excellent - started on high intensity atorvastatin 40 mg. Recommend checking outpatient LP(a). Smoking cessation again strongly encouraged.    Impaired Glucose Tolerance A1c 5.3  EtOH abuse No evidence of withdrawal during this admission - counseled on avoidance of EtOH abuse  Tobacco dependence  Counseled on absolute need for abstinence and  direct link to CAD   Paronychia R thumb paronychia noted on day of d/c - pt reports this is a common occurrence for him, and that he frequently lances them at home himself - I offered to have Gen Surgery come by to do so w/ an digital block prior to his d/c, but he declined saying he would take care of it himself - I counseled him on stringent sterile technique, and the need to keep the wound clean and protected after drainage - I again encouraged him to let Surgery address it prior to his d/c but he persisted in his desire to manage it himself at home - I counseled him to seek immediate care at an UC facility should his thumb worsen (more red, recurrent puss, severe pain)  Allergies as of 01/06/2018   No Known Allergies     Medication List    TAKE these medications   ASA-APAP-Salicyl-Caff Tabs Take 1-2 tablets by mouth daily as needed (Pain).   aspirin EC 81 MG tablet Take 1 tablet (81 mg total) by mouth daily.   atorvastatin 40 MG tablet Commonly known as:  LIPITOR Take 1 tablet (40 mg total) by mouth daily at 6 PM.       Day of Discharge BP 134/76 (BP Location: Right Arm)   Pulse 79   Temp 98.7 F (37.1 C) (Oral)   Resp 16   Ht 5\' 9"  (1.753 m)   Wt 80.1 kg   SpO2 96%   BMI 26.08 kg/m   Physical Exam: General: No acute respiratory distress Lungs: Clear to auscultation bilaterally without wheezes or crackles Cardiovascular: Regular rate and rhythm without murmur gallop or rub  normal S1 and S2 Abdomen: Nontender, nondistended, soft, bowel sounds positive, no rebound, no ascites, no appreciable mass Extremities: No significant cyanosis, clubbing, or edema bilateral lower extremities - R thumbnail medial aspect paronychia ~.5cm in diameter abscess w surrounding rim of erythema w/o induration of remaining thumb   Basic Metabolic Panel: Recent Labs  Lab 01/04/18 1726 01/05/18 0257 01/06/18 0451  NA 139 138 139  K 4.1 3.8 3.5  CL 108 106 104  CO2 20* 23 24  GLUCOSE  111* 103* 82  BUN 21* 15 13  CREATININE 0.90 0.76 0.78  CALCIUM 9.3 8.9 8.6*  MG  --   --  2.0    Liver Function Tests: Recent Labs  Lab 01/05/18 0257 01/06/18 0451  AST 23 17  ALT 23 18  ALKPHOS 59 56  BILITOT 1.2 0.9  PROT 6.5 6.5  ALBUMIN 3.8 3.5    Coags: Recent Labs  Lab 01/04/18 2120  INR 1.03    CBC: Recent Labs  Lab 01/04/18 1726 01/05/18 0257 01/06/18 0451  WBC 8.9 10.1 7.9  HGB 14.8 13.9 14.2  HCT 44.0 41.4 41.6  MCV 91.9 92.0 90.8  PLT 253 220 210    Cardiac Enzymes: Recent Labs  Lab 01/04/18 2120 01/05/18 0257 01/05/18 0908  TROPONINI <0.03 <0.03 <0.03    Recent Results (from the past 240 hour(s))  MRSA PCR Screening     Status: None   Collection Time: 01/04/18  9:23 PM  Result Value Ref Range Status   MRSA by PCR NEGATIVE NEGATIVE Final    Comment:        The GeneXpert MRSA Assay (FDA approved for NASAL specimens only), is one component of a comprehensive MRSA colonization surveillance program. It is not intended to diagnose MRSA infection nor to guide or monitor treatment for MRSA infections. Performed at [facility], [address] [place], [place] [address]      Time spent in discharge (includes decision making & examination of pt): 25 minutes  01/06/2018, 12:00 PM   Lonia BloodJeffrey T. Tiane Szydlowski, MD [facility] Office  [phone] Pager [phone]  On-Call/Text Page:      [email or web address]      password [email or web address]

## 2018-01-06 NOTE — Discharge Instructions (Signed)
Chest Wall Pain °Chest wall pain is pain in or around the bones and muscles of your chest. Sometimes, an injury causes this pain. Sometimes, the cause may not be known. This pain may take several weeks or longer to get better. °Follow these instructions at home: °Pay attention to any changes in your symptoms. Take these actions to help with your pain: °· Rest as told by your doctor. °· Avoid activities that cause pain. Try not to use your chest, belly (abdominal), or side muscles to lift heavy things. °· If directed, apply ice to the painful area: °? Put ice in a plastic bag. °? Place a towel between your skin and the bag. °? Leave the ice on for 20 minutes, 2-3 times per day. °· Take over-the-counter and prescription medicines only as told by your doctor. °· Do not use tobacco products, including cigarettes, chewing tobacco, and e-cigarettes. If you need help quitting, ask your doctor. °· Keep all follow-up visits as told by your doctor. This is important. ° °Contact a doctor if: °· You have a fever. °· Your chest pain gets worse. °· You have new symptoms. °Get help right away if: °· You feel sick to your stomach (nauseous) or you throw up (vomit). °· You feel sweaty or light-headed. °· You have a cough with phlegm (sputum) or you cough up blood. °· You are short of breath. °This information is not intended to replace advice given to you by your health care provider. Make sure you discuss any questions you have with your health care provider. °Document Released: 10/30/2007 Document Revised: 10/19/2015 Document Reviewed: 08/08/2014 °Elsevier Interactive Patient Education © 2018 Elsevier Inc. ° °

## 2018-01-06 NOTE — Care Management Note (Signed)
Case Management Note  Patient Details  Name: Marc Waters MRN: [id number] Date of Birth: 10/06/1965  Subjective/Objective:  Pt presented for Chest Pain- PTA no PCP, however pt is working and can afford medications.                   Action/Plan: Appointment was [facility] the [facility] hospital [facility]. [facility] the [facility] for [facility]. Cost will range from $4.00-$10.00.   Expected Discharge Date:  01/06/18               Expected Discharge Plan:  Home/Self Care  In-House Referral:  NA  Discharge planning Services  CM Consult, Follow-up appt scheduled, Indigent [facility], Medication Assistance  Post Acute Care Choice:  NA Choice offered to:  NA  DME Arranged:  N/A DME Agency:  NA  HH Arranged:  NA HH Agency:  NA  Status of Service:  Completed, signed off  If discussed at Long Length of Stay Meetings, dates discussed:    Additional Comments:  Gala LewandowskyGraves-Bigelow, Kevonta Phariss Kaye, RN 01/06/2018, 12:21 PM

## 2018-01-06 NOTE — Progress Notes (Signed)
Discharge order obtained.  IV removed intact, telemetry monitor removed.  Reviewed AVS with patient/family, including medications, activity/restrictions, follow-up appointments.  Patient and family verbalized understanding.  Questions asked and answered.  Belongings given to family/patient.   Barrington Ellisonlivia Jaymason Ledesma, RN

## 2018-01-27 NOTE — Progress Notes (Deleted)
Cardiology Office Note:    Date:  01/27/2018   ID:  Marc Waters, DOB 1965/08/11, MRN [id number]  PCP:  Patient, [name]  Cardiologist:  Chrystie Nose, MD   Referring MD: No ref. provider found   No chief complaint on file. ***  History of Present Illness:    Marc Waters is a 52 y.o. male with a PMH significant for glucose intolerance and tobacco dependence. He was recently seen in the hospital for symptoms concerning for unstable angina. He underwent CT coronary with 20-50% stenosis in the LAD but negative FFR. He was discharged on 01/06/18. Of note, he has a family history of heart disease in both of his parents. He does not see a PCP.   He returns today for his hospital follow up.       Past Medical History:  Diagnosis Date  . Glucose intolerance     Past Surgical History:  Procedure Laterality Date  . FOOT SURGERY      Current Medications: No outpatient medications have been marked as taking for the 01/28/18 encounter (Appointment) with Marcelino Duster, PA.     Allergies:   Patient has no known allergies.   Social History   Socioeconomic History  . Marital status: Single    Spouse name: Not on file  . Number of children: Not on file  . Years of education: Not on file  . Highest education level: Not on file  Occupational History  . Not on file  Social Needs  . Financial resource strain: Not on file  . Food insecurity:    Worry: Not on file    Inability: Not on file  . Transportation needs:    Medical: Not on file    Non-medical: Not on file  Tobacco Use  . Smoking status: Current Every Day Smoker    Packs/day: 0.50  . Smokeless tobacco: Never Used  Substance and Sexual Activity  . Alcohol use: Yes    Alcohol/week: 24.0 standard drinks    Types: 24 Cans of beer per week  . Drug use: Not Currently    Types: Cocaine  . Sexual activity: Not on file  Lifestyle  . Physical activity:    Days per week: Not on file    Minutes per  session: Not on file  . Stress: Not on file  Relationships  . Social connections:    Talks on phone: Not on file    Gets together: Not on file    Attends religious service: Not on file    Active member of club or organization: Not on file    Attends meetings of clubs or organizations: Not on file    Relationship status: Not on file  Other Topics Concern  . Not on file  Social History Narrative  . Not on file     Family History: The patient's ***family history includes CAD in his father and mother; COPD in his father; Heart failure in his father; Stroke in his mother.  ROS:   Please see the history of present illness.    *** All other systems reviewed and are negative.  EKGs/Labs/Other Studies Reviewed:    The following studies were reviewed today:  Coronary CT with FFR 01/05/18: IMPRESSION: 1. Coronary calcium score of 262. This was 67 percentile for age and sex matched control. 2. Normal coronary origin with right and left dominance. 3. Moderate CAD in the proximal LAD. Additional analysis with CT FFR will be submitted. IMPRESSION: 1.  CT FFR analysis didn't show any significant stenosis.   Echo 01/05/18: Study Conclusions - Left ventricle: The cavity size was mildly dilated. Wall   thickness was normal. Systolic function was normal. The estimated   ejection fraction was in the range of 50% to 55%.  . EKG:  EKG is *** ordered today.  The ekg ordered today demonstrates ***  Recent Labs: 01/06/2018: ALT 18; BUN 13; Creatinine, Ser 0.78; Hemoglobin 14.2; Magnesium 2.0; Platelets 210; Potassium 3.5; Sodium 139  Recent Lipid Panel    Component Value Date/Time   CHOL 120 01/05/2018 0257   TRIG 38 01/05/2018 0257   HDL 55 01/05/2018 0257   CHOLHDL 2.2 01/05/2018 0257   VLDL 8 01/05/2018 0257   LDLCALC 57 01/05/2018 0257    Physical Exam:    VS:  There were no vitals taken for this visit.    Wt Readings from Last 3 Encounters:  01/06/18 176 lb 9.6 oz (80.1  kg)     GEN: *** Well nourished, well developed in no acute distress HEENT: Normal NECK: No JVD; No carotid bruits LYMPHATICS: No lymphadenopathy CARDIAC: ***RRR, no murmurs, rubs, gallops RESPIRATORY:  Clear to auscultation without rales, wheezing or rhonchi  ABDOMEN: Soft, non-tender, non-distended MUSCULOSKELETAL:  No edema; No deformity  SKIN: Warm and dry NEUROLOGIC:  Alert and oriented x 3 PSYCHIATRIC:  Normal affect   ASSESSMENT:    No diagnosis found. PLAN:    In order of problems listed above:  No diagnosis found.   Medication Adjustments/Labs and Tests Ordered: Current medicines are reviewed at length with the patient today.  Concerns regarding medicines are outlined above.  No orders of the defined types were placed in this encounter.  No orders of the defined types were placed in this encounter.   Signed, Marcelino Duster, PA  01/27/2018 10:09 PM    [facility]

## 2018-01-28 ENCOUNTER — Ambulatory Visit: Payer: Self-pay | Admitting: Physician Assistant

## 2018-02-10 ENCOUNTER — Inpatient Hospital Stay (INDEPENDENT_AMBULATORY_CARE_PROVIDER_SITE_OTHER): Payer: Self-pay | Admitting: Physician Assistant

## 2018-02-13 NOTE — Progress Notes (Deleted)
Cardiology Office Note:    Date:  02/13/2018   ID:  Magda Kiel, DOB 1965-06-15, MRN [id number]  PCP:  Patient, [name]  Cardiologist:  Chrystie Nose, MD   Referring MD: No ref. provider found   No chief complaint on file. ***  History of Present Illness:    Marc Waters is a 52 y.o. male with a hx of glucose intolerance, alcohol use, and tobacco dependence.  He was recently hospitalized for chest pain and underwent a CT coronary.  FFR was without significant flow-limiting stenosis.  Etiology of chest pain was determined to not be with coronary artery disease.  Echo with normal EF of 50 to 55% and no wall motion abnormality.  Differential for chest pain described as spasm versus GERD.  He was discharged on 01/06/2018 with follow-up with cardiology as needed.     Past Medical History:  Diagnosis Date  . Glucose intolerance     Past Surgical History:  Procedure Laterality Date  . FOOT SURGERY      Current Medications: No outpatient medications have been marked as taking for the 02/16/18 encounter (Appointment) with Marcelino Duster, PA.     Allergies:   Patient has no known allergies.   Social History   Socioeconomic History  . Marital status: Single    Spouse name: Not on file  . Number of children: Not on file  . Years of education: Not on file  . Highest education level: Not on file  Occupational History  . Not on file  Social Needs  . Financial resource strain: Not on file  . Food insecurity:    Worry: Not on file    Inability: Not on file  . Transportation needs:    Medical: Not on file    Non-medical: Not on file  Tobacco Use  . Smoking status: Current Every Day Smoker    Packs/day: 0.50  . Smokeless tobacco: Never Used  Substance and Sexual Activity  . Alcohol use: Yes    Alcohol/week: 24.0 standard drinks    Types: 24 Cans of beer per week  . Drug use: Not Currently    Types: Cocaine  . Sexual activity: Not on file  Lifestyle  .  Physical activity:    Days per week: Not on file    Minutes per session: Not on file  . Stress: Not on file  Relationships  . Social connections:    Talks on phone: Not on file    Gets together: Not on file    Attends religious service: Not on file    Active member of club or organization: Not on file    Attends meetings of clubs or organizations: Not on file    Relationship status: Not on file  Other Topics Concern  . Not on file  Social History Narrative  . Not on file     Family History: The patient's ***family history includes CAD in his father and mother; COPD in his father; Heart failure in his father; Stroke in his mother.  ROS:   Please see the history of present illness.    *** All other systems reviewed and are negative.  EKGs/Labs/Other Studies Reviewed:    The following studies were reviewed today: ***  EKG:  EKG is *** ordered today.  The ekg ordered today demonstrates ***  Recent Labs: 01/06/2018: ALT 18; BUN 13; Creatinine, Ser 0.78; Hemoglobin 14.2; Magnesium 2.0; Platelets 210; Potassium 3.5; Sodium 139  Recent Lipid Panel  Component Value Date/Time   CHOL 120 01/05/2018 0257   TRIG 38 01/05/2018 0257   HDL 55 01/05/2018 0257   CHOLHDL 2.2 01/05/2018 0257   VLDL 8 01/05/2018 0257   LDLCALC 57 01/05/2018 0257    Physical Exam:    VS:  There were no vitals taken for this visit.    Wt Readings from Last 3 Encounters:  01/06/18 176 lb 9.6 oz (80.1 kg)     GEN: *** Well nourished, well developed in no acute distress HEENT: Normal NECK: No JVD; No carotid bruits LYMPHATICS: No lymphadenopathy CARDIAC: ***RRR, no murmurs, rubs, gallops RESPIRATORY:  Clear to auscultation without rales, wheezing or rhonchi  ABDOMEN: Soft, non-tender, non-distended MUSCULOSKELETAL:  No edema; No deformity  SKIN: Warm and dry NEUROLOGIC:  Alert and oriented x 3 PSYCHIATRIC:  Normal affect   ASSESSMENT:    No diagnosis found. PLAN:    In order of problems  listed above:  No diagnosis found.   Medication Adjustments/Labs and Tests Ordered: Current medicines are reviewed at length with the patient today.  Concerns regarding medicines are outlined above.  No orders of the defined types were placed in this encounter.  No orders of the defined types were placed in this encounter.   Signed, Roe Rutherfordngela Nicole Duke, PA  02/13/2018 1:20 PM    [facility]

## 2018-02-16 ENCOUNTER — Ambulatory Visit: Payer: Self-pay | Admitting: Physician Assistant

## 2018-02-17 ENCOUNTER — Encounter: Payer: Self-pay | Admitting: *Deleted

## 2019-07-11 IMAGING — DX DG CHEST 2V
2 series · 2 of 2 positions shown · non-contrast
Comparison: None.

CLINICAL DATA: Pt reports centralized chest pain radiating to L arm
x approx. 2 hours. Pt reports his L arm feels numb. Pt reports some
SOB. Denies dizziness, nausea, vomiting. Smoker

EXAM:
CHEST - 2 VIEW

[w chest pa]
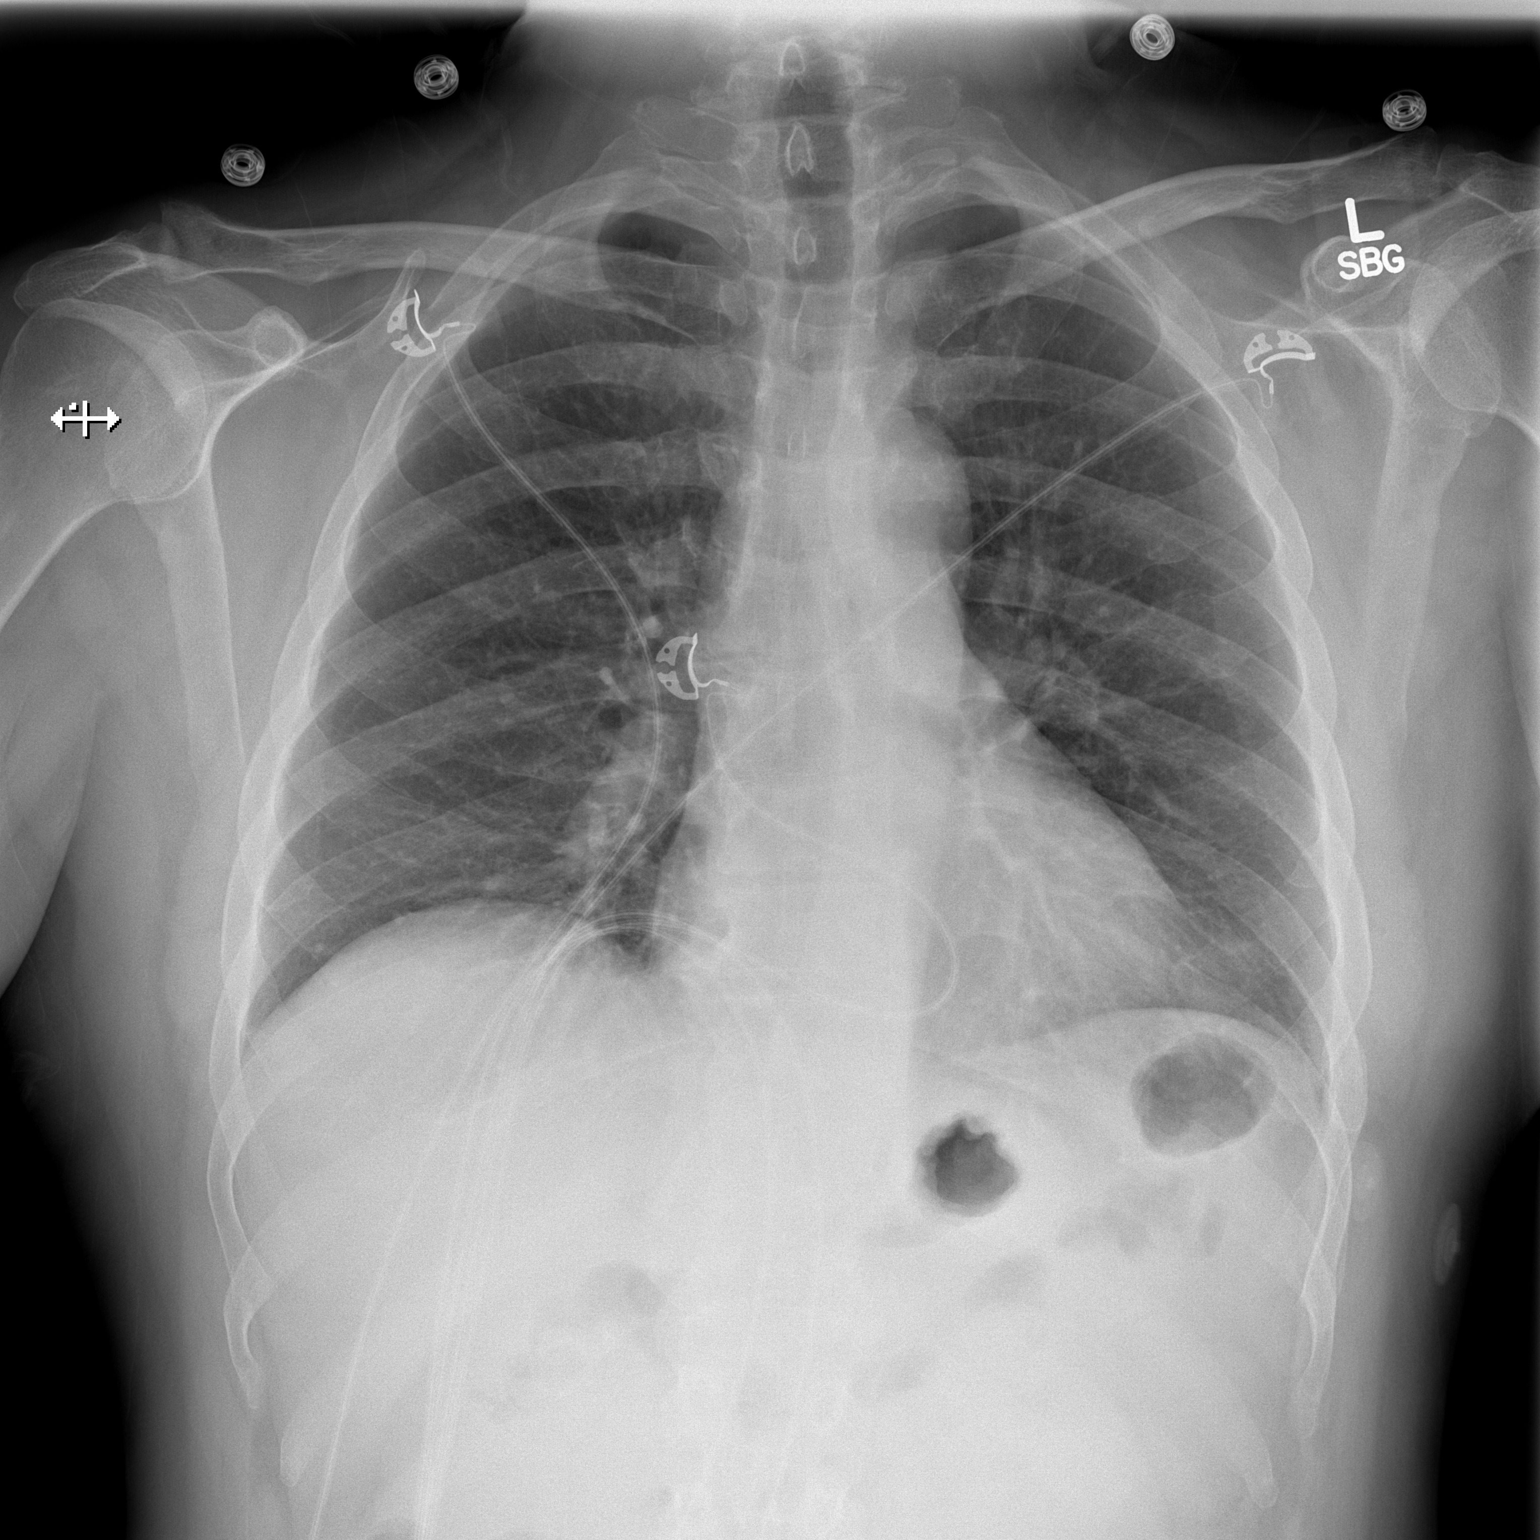

[w chest lat]
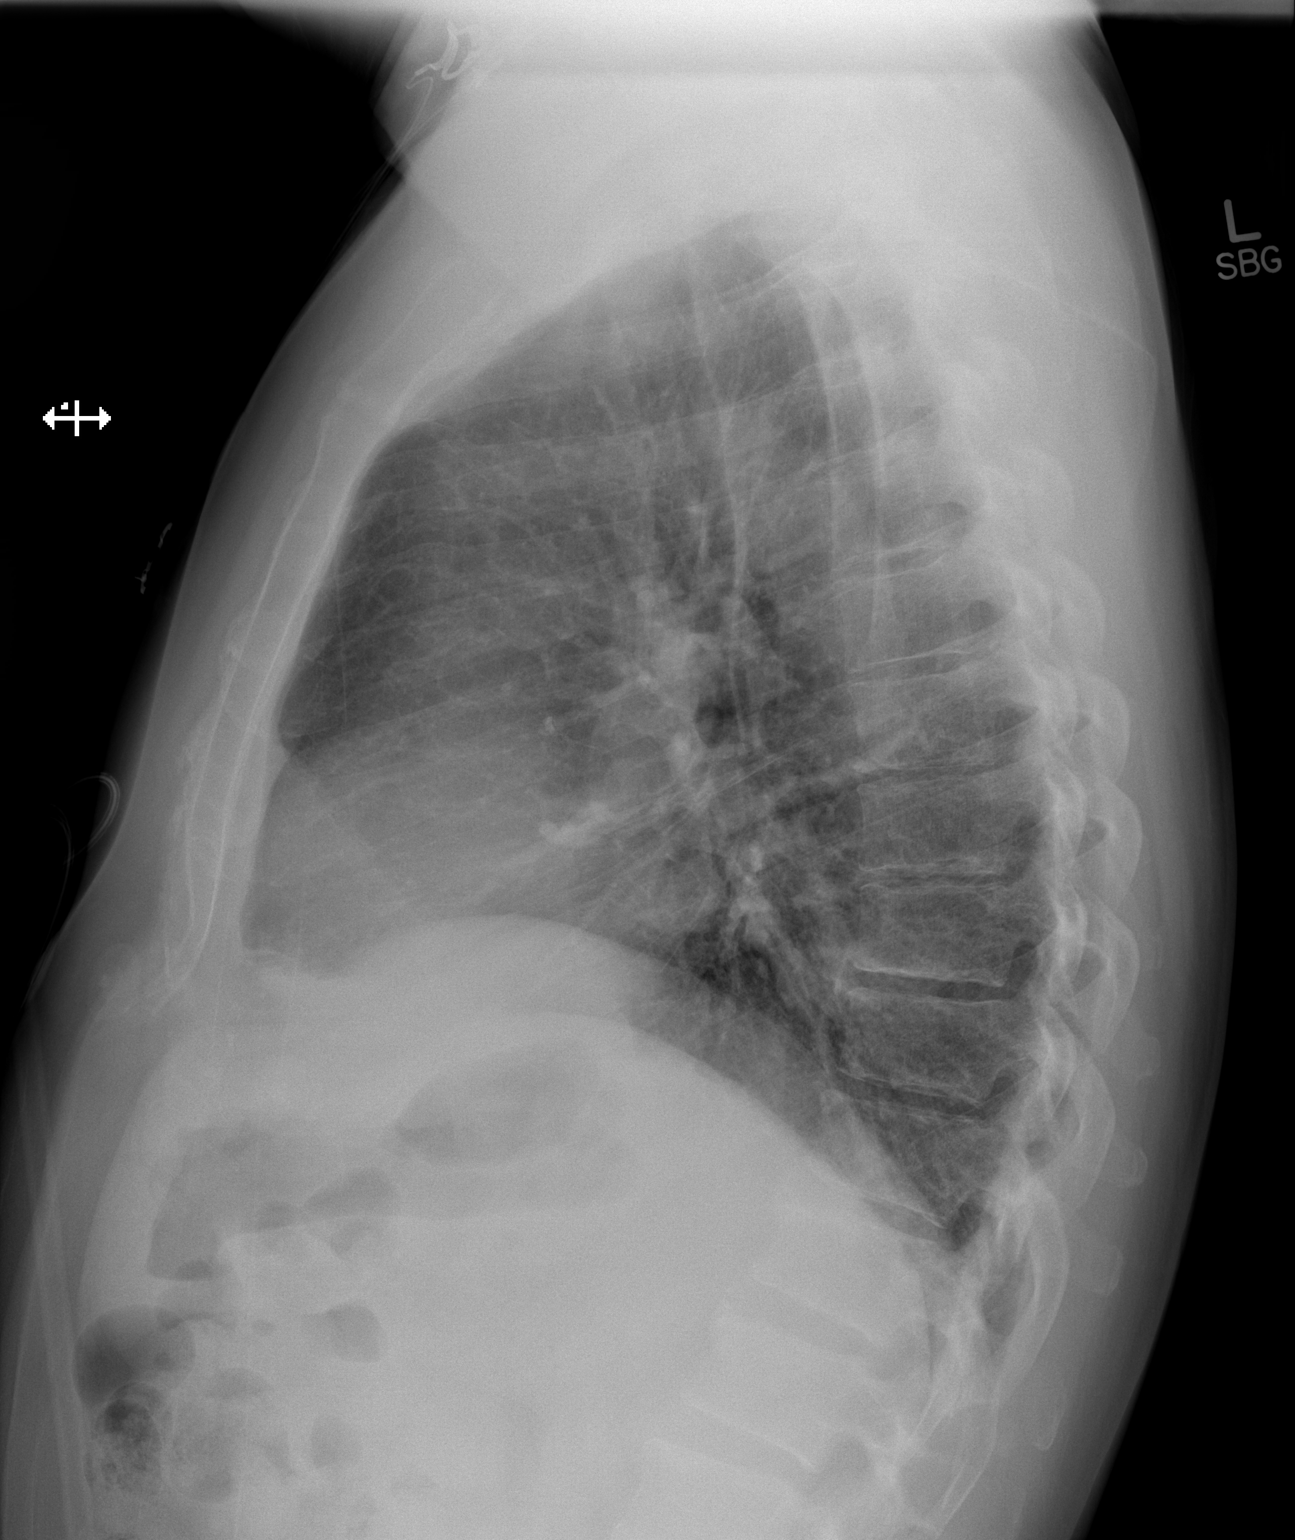

[2 of 2 positions shown; findings below may reference images not displayed]

FINDINGS: The heart size and mediastinal contours are within normal limits.
Both lungs are clear. No pleural effusion or pneumothorax. The
visualized skeletal structures are unremarkable.
IMPRESSION: No active cardiopulmonary disease.

## 2021-03-20 ENCOUNTER — Other Ambulatory Visit: Payer: Self-pay

## 2021-03-20 ENCOUNTER — Ambulatory Visit (HOSPITAL_COMMUNITY)
Admission: EM | Admit: 2021-03-20 | Discharge: 2021-03-20 | Disposition: A | Discharge status: Home / Self Care | Payer: Self-pay | Attending: Emergency Medicine | Admitting: Emergency Medicine

## 2021-03-20 ENCOUNTER — Encounter (HOSPITAL_COMMUNITY): Payer: Self-pay | Admitting: Emergency Medicine

## 2021-03-20 DIAGNOSIS — S61211A Laceration without foreign body of left index finger without damage to nail, initial encounter: Secondary | ICD-10-CM

## 2021-03-20 DIAGNOSIS — Z23 Encounter for immunization: Secondary | ICD-10-CM

## 2021-03-20 MED ORDER — TETANUS-DIPHTH-ACELL PERTUSSIS 5-2.5-18.5 LF-MCG/0.5 IM SUSY
PREFILLED_SYRINGE | INTRAMUSCULAR | Status: AC
  Filled 2021-03-20: qty 0.5

## 2021-03-20 MED ORDER — TETANUS-DIPHTH-ACELL PERTUSSIS 5-2.5-18.5 LF-MCG/0.5 IM SUSY
0.5000 mL | PREFILLED_SYRINGE | Freq: Once | INTRAMUSCULAR | Status: AC
  Administered 2021-03-20: 0.5 mL via INTRAMUSCULAR

## 2021-03-20 NOTE — ED Provider Notes (Signed)
[facility]  ____________________________________________  Time seen: Approximately 6:18 PM  I have reviewed the triage vital signs and the nursing notes.   HISTORY  Chief Complaint Laceration   Historian Patient     HPI Marc Waters is a 55 y.o. male presents to the urgent care with an avulsion type laceration along the left index finger sustained accidentally with a knife. No numbness or tingling of the left hand. No similar injuries in the past.    Past Medical History:  Diagnosis Date   Glucose intolerance      Immunizations up to date:  Yes.     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 Authorizing Provider  aspirin EC 81 MG tablet Take 1 tablet (81 mg total) by mouth daily. 01/06/18  Yes Lonia Blood, MD  ASA-APAP-Salicyl-Caff TABS Take 1-2 tablets by mouth daily as needed (Pain).    [provider]  atorvastatin (LIPITOR) 40 MG tablet Take 1 tablet (40 mg total) by mouth daily at 6 PM. 01/06/18   Lonia Blood, MD    Allergies Patient has no known allergies.  Family History  Problem Relation Age of Onset   CAD Mother        heart attack at age 32   Stroke Mother    CAD Father        CABG at aged 19, died at age 31   Heart failure Father    COPD Father     Social History Social History   Tobacco Use   Smoking status: Every Day    Packs/day: 0.50    Types: Cigarettes   Smokeless tobacco: Never  Substance Use Topics   Alcohol use: Yes    Alcohol/week: 24.0 standard drinks    Types: 24 Cans of beer per week    Drug use: Not Currently    Types: Cocaine     Review of Systems  Constitutional: No fever/chills Eyes:  No discharge ENT: No upper respiratory complaints. Respiratory: no cough. No SOB/ use of accessory muscles to breath Gastrointestinal:   No nausea, no vomiting.  No diarrhea.  No constipation. Musculoskeletal: Patient has left hand pain.  Skin: Patient has avulsion type laceration of left index finger.    ____________________________________________   PHYSICAL EXAM:  VITAL SIGNS: ED Triage Vitals  Enc Vitals Group     BP 03/20/21 1735 (!) 168/87     Pulse Rate 03/20/21 1735 78     Resp 03/20/21 1735 18     Temp 03/20/21 1735 98 F (36.7 C)     Temp src --      SpO2 03/20/21 1735 95 %     Weight --      Height --      Head Circumference --      Peak Flow --      Pain Score 03/20/21 1733 1     Pain Loc --      Pain Edu? --      Excl. in GC? --      Constitutional: Alert  and oriented. Well appearing and in no acute distress. Eyes: Conjunctivae are normal. PERRL. EOMI. Head: Atraumatic. ENT: Cardiovascular: Normal rate, regular rhythm. Normal S1 and S2.  Good peripheral circulation. Respiratory: Normal respiratory effort without tachypnea or retractions. Lungs CTAB. Good air entry to the bases with no decreased or absent breath sounds Gastrointestinal: Bowel sounds x 4 quadrants. Soft and nontender to palpation. No guarding or rigidity. No distention. Musculoskeletal: Full range of motion to all extremities. No obvious deformities noted Neurologic:  Normal for age. No gross focal neurologic deficits are appreciated.  Skin: Patient has avulsion type laceration of left index finger. Psychiatric: Mood and affect are normal for age. Speech and behavior are normal.   ____________________________________________   LABS (all labs ordered are listed, but only abnormal results are displayed)  Labs Reviewed - No data to  display ____________________________________________  EKG   ____________________________________________  RADIOLOGY   No results found.  ____________________________________________    PROCEDURES  Procedure(s) performed:     Procedures     Medications  Tdap (BOOSTRIX) injection 0.5 mL (has no administration in time range)     ____________________________________________   INITIAL IMPRESSION / ASSESSMENT AND PLAN / ED COURSE  Pertinent labs & imaging results that were available during my care of the patient were reviewed by me and considered in my medical decision making (see chart for details).      Assessment and plan Avulsion type laceration 55 year old male presents to the urgent care with an avulsion type laceration of left index finger.  Bleeding had stopped by the time I examined patient.  Wound was cleansed and Dermabond was applied with a clean dressing.  His tetanus status was updated in the urgent care and a work note was provided.     ____________________________________________  FINAL CLINICAL IMPRESSION(S) / ED DIAGNOSES  Final diagnoses:  Laceration of left index finger without foreign body without damage to nail, initial encounter      NEW MEDICATIONS STARTED DURING THIS VISIT:  ED Discharge Orders     None           This chart was dictated using voice recognition software/Dragon. Despite best efforts to proofread, errors can occur which can change the meaning. Any change was purely unintentional.     Orvil Feil, PA-C 03/20/21 1825

## 2021-03-20 NOTE — ED Triage Notes (Signed)
Pt is present today with a laceration on his left pointer finger. Pt cut his finger with a kitchen knife and denies taking any blood thinners

## 2021-03-20 NOTE — Discharge Instructions (Signed)
Keep wound clean and dry for the next 24 hours.
